# Patient Record
Sex: Male | Born: 1985 | Race: Black or African American | Hispanic: No | Marital: Single | Smoking: Never smoker
Health system: Southern US, Community
[De-identification: ages and names within clinical notes are randomized; demographics above are authoritative.]

## PROBLEM LIST (undated history)

## (undated) DIAGNOSIS — A4902 Methicillin resistant Staphylococcus aureus infection, unspecified site: Secondary | ICD-10-CM

## (undated) HISTORY — PX: WRIST SURGERY: SHX841

---

## 2005-09-21 ENCOUNTER — Emergency Department: Payer: Self-pay | Admitting: Internal Medicine

## 2008-06-11 ENCOUNTER — Emergency Department: Payer: Self-pay | Admitting: Emergency Medicine

## 2008-06-13 ENCOUNTER — Emergency Department: Payer: Self-pay | Admitting: Unknown Physician Specialty

## 2012-11-20 ENCOUNTER — Emergency Department: Payer: Self-pay | Admitting: Internal Medicine

## 2012-12-02 ENCOUNTER — Emergency Department: Payer: Self-pay | Admitting: Emergency Medicine

## 2015-08-01 ENCOUNTER — Encounter: Payer: Self-pay | Admitting: Podiatry

## 2015-08-01 NOTE — Telephone Encounter (Signed)
This encounter was created in error - please disregard.

## 2018-10-21 ENCOUNTER — Other Ambulatory Visit: Payer: Self-pay

## 2018-10-21 DIAGNOSIS — Z20822 Contact with and (suspected) exposure to covid-19: Secondary | ICD-10-CM

## 2018-10-23 LAB — NOVEL CORONAVIRUS, NAA: SARS-CoV-2, NAA: NOT DETECTED

## 2021-03-27 ENCOUNTER — Emergency Department: Payer: Self-pay

## 2021-03-27 ENCOUNTER — Other Ambulatory Visit: Payer: Self-pay

## 2021-03-27 ENCOUNTER — Emergency Department
Admission: EM | Admit: 2021-03-27 | Discharge: 2021-03-27 | Disposition: A | Discharge status: Home / Self Care | Payer: Self-pay | Attending: Emergency Medicine | Admitting: Emergency Medicine

## 2021-03-27 ENCOUNTER — Encounter: Payer: Self-pay | Admitting: Emergency Medicine

## 2021-03-27 DIAGNOSIS — S61215A Laceration without foreign body of left ring finger without damage to nail, initial encounter: Secondary | ICD-10-CM | POA: Insufficient documentation

## 2021-03-27 DIAGNOSIS — W25XXXA Contact with sharp glass, initial encounter: Secondary | ICD-10-CM | POA: Insufficient documentation

## 2021-03-27 DIAGNOSIS — S61219A Laceration without foreign body of unspecified finger without damage to nail, initial encounter: Secondary | ICD-10-CM

## 2021-03-27 DIAGNOSIS — Z23 Encounter for immunization: Secondary | ICD-10-CM | POA: Insufficient documentation

## 2021-03-27 HISTORY — DX: Methicillin resistant Staphylococcus aureus infection, unspecified site: A49.02

## 2021-03-27 MED ORDER — TETANUS-DIPHTH-ACELL PERTUSSIS 5-2.5-18.5 LF-MCG/0.5 IM SUSY
0.5000 mL | PREFILLED_SYRINGE | Freq: Once | INTRAMUSCULAR | Status: AC
  Administered 2021-03-27: 0.5 mL via INTRAMUSCULAR
  Filled 2021-03-27: qty 0.5

## 2021-03-27 NOTE — ED Notes (Signed)
Pt presents with gauze bandaged right hand from triage, red blood visualized on gauze on 2nd finger medial. Pt states pain of 6 in right hand. ?

## 2021-03-27 NOTE — ED Triage Notes (Signed)
Pt to ED via POV, states reached into a trashcan and cut his L hand on a broken bottle. Pt with approx 1in laceration to palmar side of L hand, and multiple avulsions to distal portion L ring finger. Bleeding controlled with dry bandages applied by this RN in triage, CMS intact in triage. Pt does endorse ETOH use at this time.  ?

## 2021-03-27 NOTE — ED Provider Notes (Signed)
? ?[facility] ?Provider Note ? ? ? Event Date/Time  ? First MD Initiated Contact with Patient 03/27/21 [phone]   ?  (approximate) ? ? ?History  ? ?Laceration ? ? ?HPI ? ?Kristopher Walker is a 36 y.o. male with no significant past medical history who presents for evaluation of left hand and finger laceration.  Patient reports that he reached into a trash can to grab a broken bottle of liquor and cut his hand and left ring finger.  Positive EtOH.  Last tetanus shot was greater than 10 years ago.  Patient is complaining of mild throbbing pain.  No other injuries ?  ? ? ?Past Medical History:  ?Diagnosis Date  ? MRSA (methicillin resistant Staphylococcus aureus)   ? ? ?Past Surgical History:  ?Procedure Laterality Date  ? WRIST SURGERY    ? ? ? ?Physical Exam  ? ?Triage Vital Signs: ?ED Triage Vitals  ?Enc Vitals Group  ?   BP 03/27/21 0416 (!) 141/100  ?   Pulse Rate 03/27/21 0416 96  ?   Resp 03/27/21 0416 20  ?   Temp 03/27/21 0416 98.6 ?F (37 ?C)  ?   Temp Source 03/27/21 0416 Oral  ?   SpO2 03/27/21 0416 96 %  ?   Weight 03/27/21 0419 160 lb (72.6 kg)  ?   Height 03/27/21 0419 5\' 7"  (1.702 m)  ?   Head Circumference --   ?   Peak Flow --   ?   Pain Score 03/27/21 0418 7  ?   Pain Loc --   ?   Pain Edu? --   ?   Excl. in [place]? --   ? ? ?Most recent vital signs: ?Vitals:  ? 03/27/21 0516 03/27/21 0542  ?BP: 119/79 126/90  ?Pulse: 81 74  ?Resp: 18 18  ?Temp:    ?SpO2: 98% 97%  ? ? ? ?Constitutional: Alert and oriented. Well appearing and in no apparent distress. ?HEENT: ?     Head: Normocephalic and atraumatic.    ?     Eyes: Conjunctivae are normal. Sclera is non-icteric.  ?     Mouth/Throat: Mucous membranes are moist.  ?     Neck: Supple with no signs of meningismus. ?Cardiovascular: Regular rate and rhythm.  ?Respiratory: Normal respiratory effort.  ?Musculoskeletal: Laceration with skin tear of the left ring finger and a skin tear of the left palm.  Small foreign body seen on the finger  laceration.  No nailbed involvement ?neurologic: Normal speech and language. Face is symmetric. Moving all extremities. No gross focal neurologic deficits are appreciated. ?Skin: Skin is warm, dry and intact. No rash noted. ?Psychiatric: Mood and affect are normal. Speech and behavior are normal. ? ?ED Results / Procedures / Treatments  ? ?Labs ?(all labs ordered are listed, but only abnormal results are displayed) ?Labs Reviewed - No data to display ? ? ?EKG ? ?none ? ? ?RADIOLOGY ?I, Rudene Re, attending MD, have personally viewed and interpreted the images obtained during this visit as below: ? ?Small foreign body seen ? ? ?___________________________________________________ ?Interpretation by Radiologist:  ?DG Hand Complete Left ? ?Result Date: 03/27/2021 ?CLINICAL DATA:  Hand laceration on glass. EXAM: LEFT HAND - COMPLETE 3+ VIEW COMPARISON:  None. FINDINGS: Punctate foreign body associated with the ring finger, not seen on the lateral view likely due to overlapping fingers. On the oblique view this appears superficial in location, although it could be in the skin. No fracture or  dislocation. IMPRESSION: 1. No osseous abnormality. 2. Punctate foreign body at the superficial ring finger, not localized on the lateral view due to finger overlap. Electronically Signed   By: Jorje Guild M.D.   On: 03/27/2021 04:45   ? ? ? ?PROCEDURES: ? ?Critical Care performed: No ? ?Procedures ? ? ? ?IMPRESSION / MDM / ASSESSMENT AND PLAN / ED COURSE  ?I reviewed the triage vital signs and the nursing notes. ? ?36 y.o. male with no significant past medical history who presents for evaluation of left hand and finger laceration after cutting his hand onto a broken liquor bottle.  Tetanus shot was given.  Wound was sterilized.  No indication for stitches.  We discussed wound care with patient and follow-up.  Discussed my standard return precautions for any signs of infection.  X-ray showing no osseous abnormality.  X-ray  did show a small foreign body which was removed. ? ? ?MEDICATIONS GIVEN IN ED: ?Medications  ?Tdap (BOOSTRIX) injection 0.5 mL (0.5 mLs Intramuscular Given 03/27/21 0537)  ? ? ? ? ?EMR reviewed none ? ? ? ?FINAL CLINICAL IMPRESSION(S) / ED DIAGNOSES  ? ?Final diagnoses:  ?Laceration of finger of left hand without foreign body without damage to nail, unspecified finger, initial encounter  ? ? ? ?Rx / DC Orders  ? ?ED Discharge Orders   ? ? None  ? ?  ? ? ? ?Note:  This document was prepared using Dragon voice recognition software and may include unintentional dictation errors. ? ? ?Please note:  Patient was evaluated in Emergency Department today for the symptoms described in the history of present illness. Patient was evaluated in the context of the global COVID-19 pandemic, which necessitated consideration that the patient might be at risk for infection with the SARS-CoV-2 virus that causes COVID-19. Institutional protocols and algorithms that pertain to the evaluation of patients at risk for COVID-19 are in a state of rapid change based on information released by regulatory bodies including the CDC and federal and state organizations. These policies and algorithms were followed during the patient's care in the ED.  Some ED evaluations and interventions may be delayed as a result of limited staffing during the pandemic. ? ? ? ? ?  ?Rudene Re, MD ?03/27/21 [phone] ? ?

## 2021-03-27 NOTE — ED Triage Notes (Signed)
FIRST NURSE NOTE:  Pt has multiple lacerations to L ring finger and palm of hand, pt states he cut it on glass ?

## 2023-09-17 IMAGING — DX DG HAND COMPLETE 3+V*L*
3 series · 3 of 3 positions shown · non-contrast
Comparison: None.

CLINICAL DATA: Hand laceration on glass.

EXAM:
LEFT HAND - COMPLETE 3+ VIEW

[hand ap]
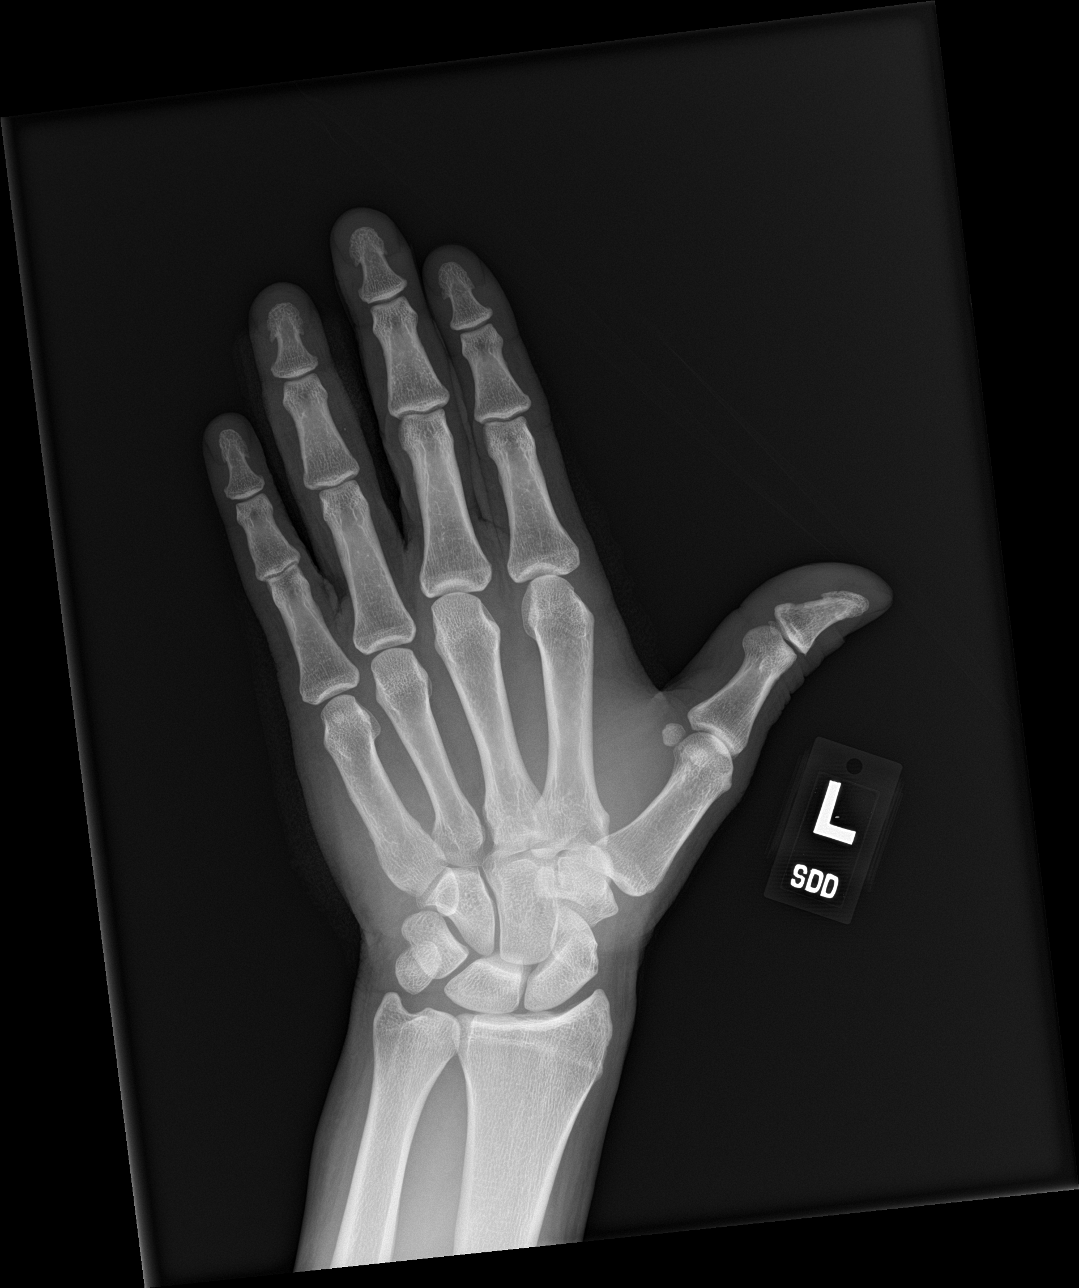

[hand obl]
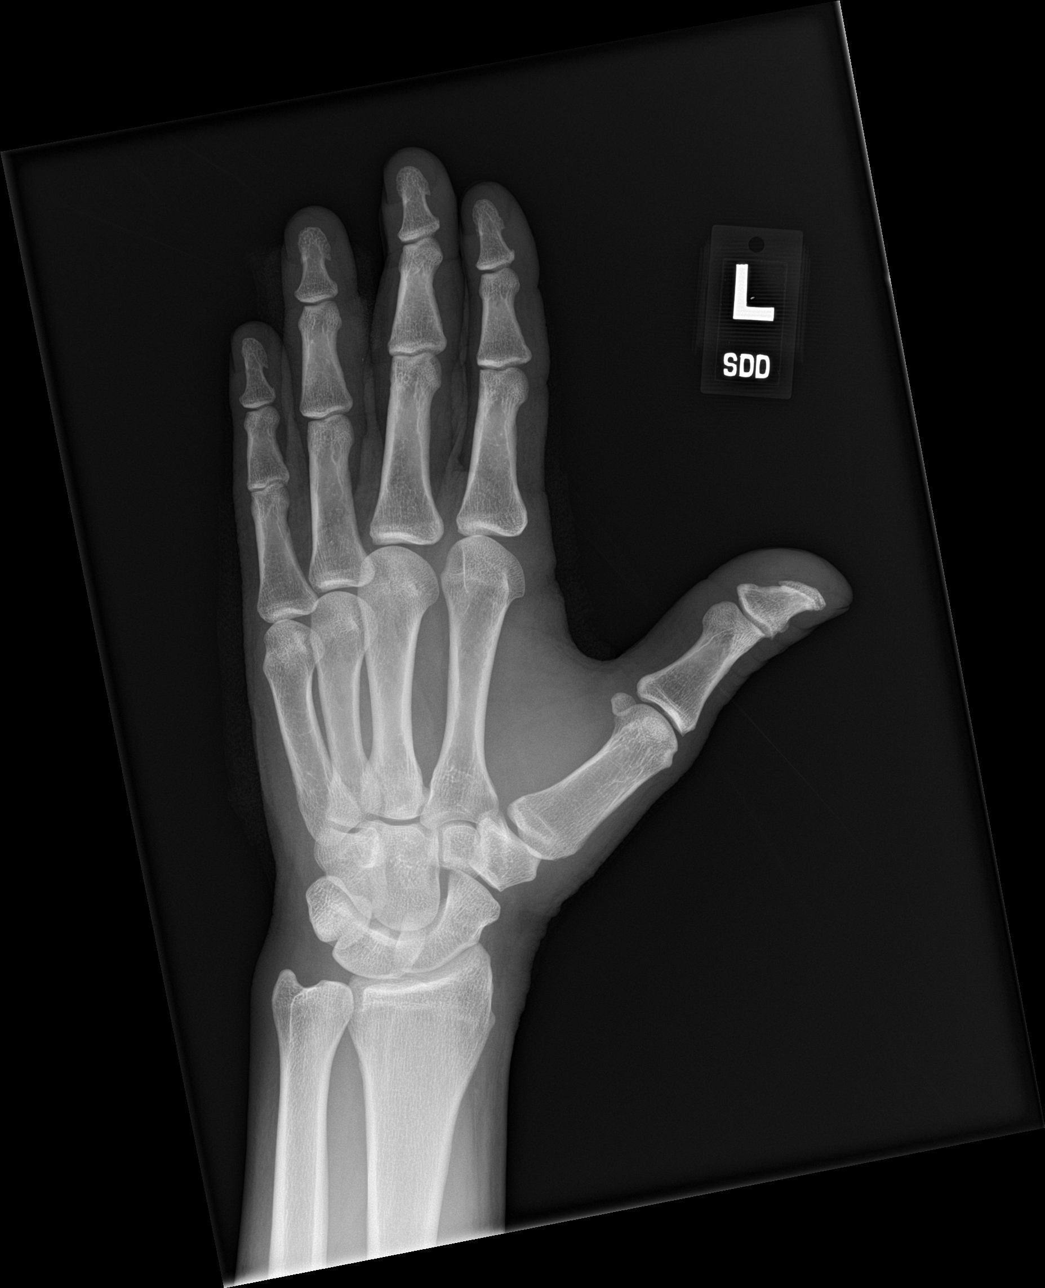

[hand lat]
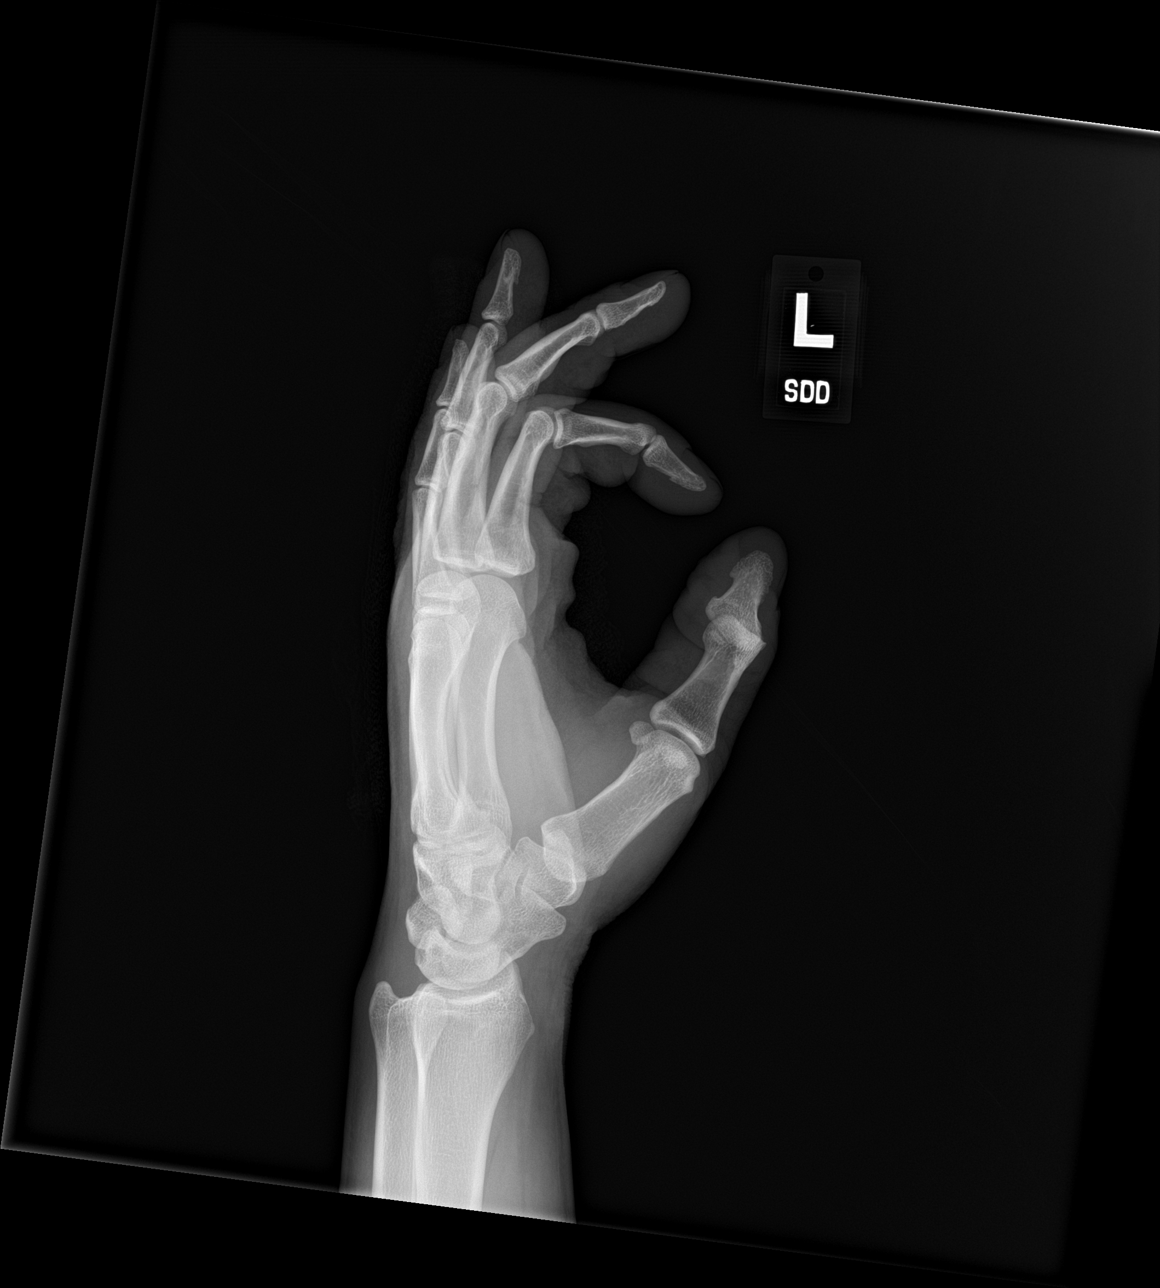

[3 of 3 positions shown; findings below may reference images not displayed]

FINDINGS: Punctate foreign body associated with the ring finger, not seen on
the lateral view likely due to overlapping fingers. On the oblique
view this appears superficial in location, although it could be in
the skin. No fracture or dislocation.
IMPRESSION: 1. No osseous abnormality.
2. Punctate foreign body at the superficial ring finger, not
localized on the lateral view due to finger overlap.
